# Patient Record
Sex: Male | Born: 1940 | Race: White | Hispanic: No | Marital: Married | Smoking: Never smoker
Health system: Southern US, Community
[De-identification: ages and names within clinical notes are randomized; demographics above are authoritative.]

## PROBLEM LIST (undated history)

## (undated) DIAGNOSIS — I10 Essential (primary) hypertension: Secondary | ICD-10-CM

## (undated) HISTORY — PX: NO PAST SURGERIES: SHX2092

---

## 2013-09-16 ENCOUNTER — Emergency Department (HOSPITAL_COMMUNITY)
Admission: EM | Admit: 2013-09-16 | Discharge: 2013-09-16 | Disposition: A | Discharge status: Home / Self Care | Payer: 59 | Attending: Emergency Medicine | Admitting: Emergency Medicine

## 2013-09-16 ENCOUNTER — Encounter (HOSPITAL_COMMUNITY): Payer: Self-pay | Admitting: Emergency Medicine

## 2013-09-16 DIAGNOSIS — T6391XA Toxic effect of contact with unspecified venomous animal, accidental (unintentional), initial encounter: Secondary | ICD-10-CM | POA: Insufficient documentation

## 2013-09-16 DIAGNOSIS — Y929 Unspecified place or not applicable: Secondary | ICD-10-CM | POA: Diagnosis not present

## 2013-09-16 DIAGNOSIS — IMO0002 Reserved for concepts with insufficient information to code with codable children: Secondary | ICD-10-CM | POA: Insufficient documentation

## 2013-09-16 DIAGNOSIS — T63441A Toxic effect of venom of bees, accidental (unintentional), initial encounter: Secondary | ICD-10-CM

## 2013-09-16 DIAGNOSIS — I10 Essential (primary) hypertension: Secondary | ICD-10-CM | POA: Insufficient documentation

## 2013-09-16 DIAGNOSIS — Y939 Activity, unspecified: Secondary | ICD-10-CM | POA: Insufficient documentation

## 2013-09-16 DIAGNOSIS — Z79899 Other long term (current) drug therapy: Secondary | ICD-10-CM | POA: Insufficient documentation

## 2013-09-16 DIAGNOSIS — T63461A Toxic effect of venom of wasps, accidental (unintentional), initial encounter: Secondary | ICD-10-CM | POA: Insufficient documentation

## 2013-09-16 HISTORY — DX: Essential (primary) hypertension: I10

## 2013-09-16 MED ORDER — DIPHENHYDRAMINE HCL 25 MG PO TABS: 50.0000 mg | ORAL_TABLET | Freq: Four times a day (QID) | ORAL | Status: DC

## 2013-09-16 MED ORDER — FAMOTIDINE IN NACL 20-0.9 MG/50ML-% IV SOLN
20.0000 mg | Freq: Once | INTRAVENOUS | Status: AC
  Administered 2013-09-16: 20 mg via INTRAVENOUS
  Filled 2013-09-16: qty 50

## 2013-09-16 MED ORDER — ONDANSETRON HCL 4 MG/2ML IJ SOLN
4.0000 mg | Freq: Once | INTRAMUSCULAR | Status: AC
Start: 2013-09-16 — End: 2013-09-16
  Administered 2013-09-16: 4 mg via INTRAVENOUS
  Filled 2013-09-16: qty 2

## 2013-09-16 MED ORDER — PREDNISONE 10 MG PO TABS: 60.0000 mg | ORAL_TABLET | Freq: Every day | ORAL | Status: DC

## 2013-09-16 MED ORDER — METHYLPREDNISOLONE SODIUM SUCC 125 MG IJ SOLR
125.0000 mg | Freq: Once | INTRAMUSCULAR | Status: AC
  Administered 2013-09-16: 125 mg via INTRAVENOUS
  Filled 2013-09-16: qty 2

## 2013-09-16 MED ORDER — DIPHENHYDRAMINE HCL 50 MG/ML IJ SOLN
25.0000 mg | Freq: Once | INTRAMUSCULAR | Status: AC
  Administered 2013-09-16: 25 mg via INTRAVENOUS
  Filled 2013-09-16: qty 1

## 2013-09-16 NOTE — ED Provider Notes (Signed)
CSN: [id number]     Arrival date & time 09/16/13  1548 History   First MD Initiated Contact with Patient 09/16/13 1555     Chief Complaint  Patient presents with  . Insect Bite     (Consider location/radiation/quality/duration/timing/severity/associated sxs/prior Treatment) HPI Pt presenting with c/o being stung on the left side of his face.  He states he was working in the yard and lifted an old log and immediately felt a sting on his face. He c/o itching throughout his body, feeling his legs tingling. He had one episode of emesis and now continues to c/o nausea.  No lip or tongue swelling.  Does feel somewhat lightheaded.  No syncope.  No difficulty breathing.  He has had "bad reaction" to hornet sting in the past, states this was many years ago.  There are no other associated systemic symptoms, there are no other alleviating or modifying factors.   Past Medical History  Diagnosis Date  . Hypertension    History reviewed. No pertinent past surgical history. No family history on file. History  Substance Use Topics  . Smoking status: Never Smoker   . Smokeless tobacco: Not on file  . Alcohol Use: No    Review of Systems ROS reviewed and all otherwise negative except for mentioned in HPI    Allergies  Review of patient's allergies indicates no known allergies.  Home Medications   Prior to Admission medications   Medication Sig Start Date End Date Taking? Authorizing Provider  acidophilus (RISAQUAD) CAPS capsule Take 1 capsule by mouth daily.   Yes Historical Provider, MD  BENICAR 20 MG tablet Take 20 mg by mouth every morning.  09/08/13  Yes Historical Provider, MD  Multiple Vitamin (MULTIVITAMIN WITH MINERALS) TABS tablet Take 1 tablet by mouth daily.   Yes Historical Provider, MD  diphenhydrAMINE (BENADRYL) 25 MG tablet Take 2 tablets (50 mg total) by mouth every 6 (six) hours. Take 1-2 tablets every 6 hours x 2 days, then space out to an as needed basis 09/16/13   Threasa Beards, MD  predniSONE (DELTASONE) 10 MG tablet Take 6 tablets (60 mg total) by mouth daily. Take 6, 5, 4, 3, 2, 1 tab po daily qD x 3 days each until all are finished 09/16/13   Threasa Beards, MD   BP 144/84  Pulse 91  Temp(Src) 98.3 F (36.8 C) (Oral)  Resp 21  SpO2 99% Vitals reviewed Physical Exam Physical Examination: General appearance - alert, well appearing, and in no distress Mental status - alert, oriented to person, place, and time Eyes -no conjunctival injection, no scleral icterus Mouth - mucous membranes moist, pharynx normal without lesions Chest - clear to auscultation, no wheezes, rales or rhonchi, symmetric air entry, no increased respiratory effort Heart - normal rate, regular rhythm, normal S1, S2, no murmurs, rubs, clicks or gallops Abdomen - soft, nontender, nondistended, no masses or organomegaly Extremities - peripheral pulses normal, no pedal edema, no clubbing or cyanosis Skin - normal coloration and turgor other than mild hives over anterior chest and neck and face  ED Course  Procedures (including critical care time)  6:03 PM pt has had improvement in his itching, no difficulty breathing or airway involvement Labs Review Labs Reviewed - No data to display  Imaging Review No results found.   EKG Interpretation   Date/Time:  Saturday September 16 2013 17:12:27 EDT Ventricular Rate:  86 PR Interval:  151 QRS Duration: 100 QT Interval:  378 QTC Calculation: 452  R Axis:   90 Text Interpretation:  Sinus rhythm Borderline right axis deviation No old  tracing to compare Confirmed by [facility]  MD, [place] [phone]) on 09/16/2013  10:21:47 PM      MDM   Final diagnoses:  Allergic reaction to bee sting, accidental or unintentional, initial encounter    Pt presenting with itching rash/hives, and nausea and vomiting after being stung by a bee.  Pt treated with benadryl, solumedrol, pepcid- pt feeling improved.  No signs of airway involvement or  anaphylaxis.  Discharged with strict return precautions.  Pt agreeable with plan.    Threasa Beards, MD 09/16/13 [phone]

## 2013-09-16 NOTE — ED Notes (Signed)
Per pt, states he was working in the yard and something bit him on the left side of his face-feels like face is swelling, N/V-states his feet are tingling

## 2013-09-16 NOTE — Discharge Instructions (Signed)
Return to the ED with any concerns including difficulty breathing, vomiting, fainting, decreased level of alertness/lethargy, or any other alarming symptoms

## 2013-09-16 NOTE — ED Notes (Addendum)
Pt reports itching has calmed. Pt lung sounds are equal and clear. Pruritic rash to neck, face,arms, and chest. Ice offered for cheek pain however pt denies offer.

## 2014-11-14 DIAGNOSIS — Z136 Encounter for screening for cardiovascular disorders: Secondary | ICD-10-CM | POA: Diagnosis not present

## 2014-11-14 DIAGNOSIS — Z0000 Encounter for general adult medical examination without abnormal findings: Secondary | ICD-10-CM | POA: Diagnosis not present

## 2014-11-14 DIAGNOSIS — Z79899 Other long term (current) drug therapy: Secondary | ICD-10-CM | POA: Diagnosis not present

## 2014-11-14 DIAGNOSIS — Z125 Encounter for screening for malignant neoplasm of prostate: Secondary | ICD-10-CM | POA: Diagnosis not present

## 2014-11-14 DIAGNOSIS — Z23 Encounter for immunization: Secondary | ICD-10-CM | POA: Diagnosis not present

## 2014-11-14 DIAGNOSIS — L570 Actinic keratosis: Secondary | ICD-10-CM | POA: Diagnosis not present

## 2014-12-31 DIAGNOSIS — N401 Enlarged prostate with lower urinary tract symptoms: Secondary | ICD-10-CM | POA: Diagnosis not present

## 2014-12-31 DIAGNOSIS — N138 Other obstructive and reflux uropathy: Secondary | ICD-10-CM | POA: Diagnosis not present

## 2014-12-31 DIAGNOSIS — R972 Elevated prostate specific antigen [PSA]: Secondary | ICD-10-CM | POA: Diagnosis not present

## 2015-01-18 DIAGNOSIS — L814 Other melanin hyperpigmentation: Secondary | ICD-10-CM | POA: Diagnosis not present

## 2015-01-18 DIAGNOSIS — B079 Viral wart, unspecified: Secondary | ICD-10-CM | POA: Diagnosis not present

## 2015-01-18 DIAGNOSIS — B078 Other viral warts: Secondary | ICD-10-CM | POA: Diagnosis not present

## 2015-01-18 DIAGNOSIS — L821 Other seborrheic keratosis: Secondary | ICD-10-CM | POA: Diagnosis not present

## 2015-01-18 DIAGNOSIS — D1801 Hemangioma of skin and subcutaneous tissue: Secondary | ICD-10-CM | POA: Diagnosis not present

## 2015-01-25 ENCOUNTER — Ambulatory Visit
Admission: RE | Admit: 2015-01-25 | Discharge: 2015-01-25 | Disposition: A | Discharge status: Home / Self Care | Payer: Medicare Other | Source: Ambulatory Visit | Attending: Family Medicine | Admitting: Family Medicine

## 2015-01-25 ENCOUNTER — Other Ambulatory Visit: Payer: Self-pay | Admitting: Family Medicine

## 2015-01-25 DIAGNOSIS — M25461 Effusion, right knee: Secondary | ICD-10-CM

## 2015-02-22 DIAGNOSIS — M25561 Pain in right knee: Secondary | ICD-10-CM | POA: Diagnosis not present

## 2015-02-22 DIAGNOSIS — M170 Bilateral primary osteoarthritis of knee: Secondary | ICD-10-CM | POA: Diagnosis not present

## 2015-02-22 DIAGNOSIS — M25061 Hemarthrosis, right knee: Secondary | ICD-10-CM | POA: Diagnosis not present

## 2015-02-22 DIAGNOSIS — M25069 Hemarthrosis, unspecified knee: Secondary | ICD-10-CM | POA: Diagnosis not present

## 2015-03-02 DIAGNOSIS — M25561 Pain in right knee: Secondary | ICD-10-CM | POA: Diagnosis not present

## 2015-03-11 DIAGNOSIS — M25061 Hemarthrosis, right knee: Secondary | ICD-10-CM | POA: Diagnosis not present

## 2015-03-11 DIAGNOSIS — M170 Bilateral primary osteoarthritis of knee: Secondary | ICD-10-CM | POA: Diagnosis not present

## 2015-03-11 DIAGNOSIS — M25561 Pain in right knee: Secondary | ICD-10-CM | POA: Diagnosis not present

## 2015-06-24 DIAGNOSIS — R972 Elevated prostate specific antigen [PSA]: Secondary | ICD-10-CM | POA: Diagnosis not present

## 2015-08-19 DIAGNOSIS — R972 Elevated prostate specific antigen [PSA]: Secondary | ICD-10-CM | POA: Diagnosis not present

## 2015-08-19 DIAGNOSIS — N400 Enlarged prostate without lower urinary tract symptoms: Secondary | ICD-10-CM | POA: Diagnosis not present

## 2015-09-02 DIAGNOSIS — Z1211 Encounter for screening for malignant neoplasm of colon: Secondary | ICD-10-CM | POA: Diagnosis not present

## 2015-11-18 DIAGNOSIS — E7800 Pure hypercholesterolemia, unspecified: Secondary | ICD-10-CM | POA: Diagnosis not present

## 2015-11-18 DIAGNOSIS — Z23 Encounter for immunization: Secondary | ICD-10-CM | POA: Diagnosis not present

## 2015-11-18 DIAGNOSIS — Z0001 Encounter for general adult medical examination with abnormal findings: Secondary | ICD-10-CM | POA: Diagnosis not present

## 2015-11-18 DIAGNOSIS — K625 Hemorrhage of anus and rectum: Secondary | ICD-10-CM | POA: Diagnosis not present

## 2015-11-18 DIAGNOSIS — Z79899 Other long term (current) drug therapy: Secondary | ICD-10-CM | POA: Diagnosis not present

## 2015-12-23 DIAGNOSIS — K640 First degree hemorrhoids: Secondary | ICD-10-CM | POA: Diagnosis not present

## 2015-12-23 DIAGNOSIS — Z1211 Encounter for screening for malignant neoplasm of colon: Secondary | ICD-10-CM | POA: Diagnosis not present

## 2015-12-23 DIAGNOSIS — K5730 Diverticulosis of large intestine without perforation or abscess without bleeding: Secondary | ICD-10-CM | POA: Diagnosis not present

## 2016-01-16 DIAGNOSIS — B078 Other viral warts: Secondary | ICD-10-CM | POA: Diagnosis not present

## 2016-01-16 DIAGNOSIS — D1801 Hemangioma of skin and subcutaneous tissue: Secondary | ICD-10-CM | POA: Diagnosis not present

## 2016-01-16 DIAGNOSIS — L814 Other melanin hyperpigmentation: Secondary | ICD-10-CM | POA: Diagnosis not present

## 2016-01-16 DIAGNOSIS — L570 Actinic keratosis: Secondary | ICD-10-CM | POA: Diagnosis not present

## 2016-01-16 DIAGNOSIS — L821 Other seborrheic keratosis: Secondary | ICD-10-CM | POA: Diagnosis not present

## 2016-02-17 DIAGNOSIS — R972 Elevated prostate specific antigen [PSA]: Secondary | ICD-10-CM | POA: Diagnosis not present

## 2016-02-24 DIAGNOSIS — R972 Elevated prostate specific antigen [PSA]: Secondary | ICD-10-CM | POA: Diagnosis not present

## 2016-02-24 DIAGNOSIS — N400 Enlarged prostate without lower urinary tract symptoms: Secondary | ICD-10-CM | POA: Diagnosis not present

## 2016-08-17 DIAGNOSIS — R972 Elevated prostate specific antigen [PSA]: Secondary | ICD-10-CM | POA: Diagnosis not present

## 2016-09-07 DIAGNOSIS — N400 Enlarged prostate without lower urinary tract symptoms: Secondary | ICD-10-CM | POA: Diagnosis not present

## 2016-09-07 DIAGNOSIS — R972 Elevated prostate specific antigen [PSA]: Secondary | ICD-10-CM | POA: Diagnosis not present

## 2016-10-16 DIAGNOSIS — Z23 Encounter for immunization: Secondary | ICD-10-CM | POA: Diagnosis not present

## 2016-12-10 DIAGNOSIS — E7800 Pure hypercholesterolemia, unspecified: Secondary | ICD-10-CM | POA: Diagnosis not present

## 2016-12-10 DIAGNOSIS — K219 Gastro-esophageal reflux disease without esophagitis: Secondary | ICD-10-CM | POA: Diagnosis not present

## 2016-12-10 DIAGNOSIS — Z0001 Encounter for general adult medical examination with abnormal findings: Secondary | ICD-10-CM | POA: Diagnosis not present

## 2016-12-10 DIAGNOSIS — Z79899 Other long term (current) drug therapy: Secondary | ICD-10-CM | POA: Diagnosis not present

## 2017-02-16 DIAGNOSIS — R972 Elevated prostate specific antigen [PSA]: Secondary | ICD-10-CM | POA: Diagnosis not present

## 2017-03-01 DIAGNOSIS — R972 Elevated prostate specific antigen [PSA]: Secondary | ICD-10-CM | POA: Diagnosis not present

## 2017-07-13 DIAGNOSIS — B078 Other viral warts: Secondary | ICD-10-CM | POA: Diagnosis not present

## 2017-07-13 DIAGNOSIS — L218 Other seborrheic dermatitis: Secondary | ICD-10-CM | POA: Diagnosis not present

## 2017-07-13 DIAGNOSIS — D1801 Hemangioma of skin and subcutaneous tissue: Secondary | ICD-10-CM | POA: Diagnosis not present

## 2017-07-13 DIAGNOSIS — D225 Melanocytic nevi of trunk: Secondary | ICD-10-CM | POA: Diagnosis not present

## 2017-07-13 DIAGNOSIS — L821 Other seborrheic keratosis: Secondary | ICD-10-CM | POA: Diagnosis not present

## 2017-08-31 DIAGNOSIS — R972 Elevated prostate specific antigen [PSA]: Secondary | ICD-10-CM | POA: Diagnosis not present

## 2017-09-09 DIAGNOSIS — R972 Elevated prostate specific antigen [PSA]: Secondary | ICD-10-CM | POA: Diagnosis not present

## 2017-10-26 DIAGNOSIS — Z91030 Bee allergy status: Secondary | ICD-10-CM | POA: Diagnosis not present

## 2017-10-26 DIAGNOSIS — D62 Acute posthemorrhagic anemia: Secondary | ICD-10-CM | POA: Diagnosis not present

## 2017-10-26 DIAGNOSIS — K921 Melena: Secondary | ICD-10-CM | POA: Diagnosis present

## 2017-10-26 DIAGNOSIS — K922 Gastrointestinal hemorrhage, unspecified: Secondary | ICD-10-CM | POA: Diagnosis not present

## 2017-10-26 DIAGNOSIS — R58 Hemorrhage, not elsewhere classified: Secondary | ICD-10-CM | POA: Diagnosis not present

## 2017-10-26 DIAGNOSIS — S0990XA Unspecified injury of head, initial encounter: Secondary | ICD-10-CM | POA: Diagnosis not present

## 2017-10-26 DIAGNOSIS — S0101XA Laceration without foreign body of scalp, initial encounter: Secondary | ICD-10-CM | POA: Diagnosis not present

## 2017-10-26 DIAGNOSIS — R51 Headache: Secondary | ICD-10-CM | POA: Diagnosis not present

## 2017-10-26 DIAGNOSIS — K5730 Diverticulosis of large intestine without perforation or abscess without bleeding: Secondary | ICD-10-CM | POA: Diagnosis not present

## 2017-10-26 DIAGNOSIS — I959 Hypotension, unspecified: Secondary | ICD-10-CM | POA: Diagnosis not present

## 2017-11-02 DIAGNOSIS — K922 Gastrointestinal hemorrhage, unspecified: Secondary | ICD-10-CM | POA: Diagnosis not present

## 2017-11-02 DIAGNOSIS — Z23 Encounter for immunization: Secondary | ICD-10-CM | POA: Diagnosis not present

## 2017-11-02 DIAGNOSIS — Z4802 Encounter for removal of sutures: Secondary | ICD-10-CM | POA: Diagnosis not present

## 2017-11-22 DIAGNOSIS — K922 Gastrointestinal hemorrhage, unspecified: Secondary | ICD-10-CM | POA: Diagnosis not present

## 2017-12-08 DIAGNOSIS — K922 Gastrointestinal hemorrhage, unspecified: Secondary | ICD-10-CM | POA: Diagnosis not present

## 2017-12-14 DIAGNOSIS — Z79899 Other long term (current) drug therapy: Secondary | ICD-10-CM | POA: Diagnosis not present

## 2017-12-14 DIAGNOSIS — K219 Gastro-esophageal reflux disease without esophagitis: Secondary | ICD-10-CM | POA: Diagnosis not present

## 2017-12-14 DIAGNOSIS — Z8719 Personal history of other diseases of the digestive system: Secondary | ICD-10-CM | POA: Diagnosis not present

## 2017-12-14 DIAGNOSIS — Z0001 Encounter for general adult medical examination with abnormal findings: Secondary | ICD-10-CM | POA: Diagnosis not present

## 2017-12-14 DIAGNOSIS — E7800 Pure hypercholesterolemia, unspecified: Secondary | ICD-10-CM | POA: Diagnosis not present

## 2017-12-14 DIAGNOSIS — Z23 Encounter for immunization: Secondary | ICD-10-CM | POA: Diagnosis not present

## 2018-01-25 DIAGNOSIS — R972 Elevated prostate specific antigen [PSA]: Secondary | ICD-10-CM | POA: Diagnosis not present

## 2018-01-25 DIAGNOSIS — N400 Enlarged prostate without lower urinary tract symptoms: Secondary | ICD-10-CM | POA: Diagnosis not present

## 2018-02-18 DIAGNOSIS — R972 Elevated prostate specific antigen [PSA]: Secondary | ICD-10-CM | POA: Diagnosis not present

## 2018-07-21 DIAGNOSIS — R972 Elevated prostate specific antigen [PSA]: Secondary | ICD-10-CM | POA: Diagnosis not present

## 2018-09-30 DIAGNOSIS — L708 Other acne: Secondary | ICD-10-CM | POA: Diagnosis not present

## 2018-09-30 DIAGNOSIS — I8393 Asymptomatic varicose veins of bilateral lower extremities: Secondary | ICD-10-CM | POA: Diagnosis not present

## 2018-09-30 DIAGNOSIS — L738 Other specified follicular disorders: Secondary | ICD-10-CM | POA: Diagnosis not present

## 2018-09-30 DIAGNOSIS — L821 Other seborrheic keratosis: Secondary | ICD-10-CM | POA: Diagnosis not present

## 2018-09-30 DIAGNOSIS — D229 Melanocytic nevi, unspecified: Secondary | ICD-10-CM | POA: Diagnosis not present

## 2018-09-30 DIAGNOSIS — D1801 Hemangioma of skin and subcutaneous tissue: Secondary | ICD-10-CM | POA: Diagnosis not present

## 2018-09-30 DIAGNOSIS — B078 Other viral warts: Secondary | ICD-10-CM | POA: Diagnosis not present

## 2018-09-30 DIAGNOSIS — L814 Other melanin hyperpigmentation: Secondary | ICD-10-CM | POA: Diagnosis not present

## 2018-10-14 DIAGNOSIS — Z23 Encounter for immunization: Secondary | ICD-10-CM | POA: Diagnosis not present

## 2019-01-21 ENCOUNTER — Ambulatory Visit: Payer: Medicare Other | Attending: Internal Medicine

## 2019-01-21 DIAGNOSIS — Z23 Encounter for immunization: Secondary | ICD-10-CM | POA: Insufficient documentation

## 2019-01-21 NOTE — Progress Notes (Signed)
   Covid-19 Vaccination Clinic  Name:  Luke Ross    MRN: [id number] DOB: Jun 23, 1940  01/21/2019  [name] was observed post Covid-19 immunization for 15 minutes without incidence. He was provided with Vaccine Information Sheet and instruction to access the V-Safe system.   [name] was instructed to call 911 with any severe reactions post vaccine: Marland Kitchen Difficulty breathing  . Swelling of your face and throat  . A fast heartbeat  . A bad rash all over your body  . Dizziness and weakness    Immunizations Administered    Name Date Dose VIS Date Route   Pfizer COVID-19 Vaccine 01/21/2019 11:05 AM 0.3 mL 12/23/2018 Intramuscular   Manufacturer: [employer], [employer]   Lot: [id number]   [place]: [id number]

## 2019-02-09 ENCOUNTER — Ambulatory Visit: Payer: Medicare Other

## 2019-02-11 ENCOUNTER — Ambulatory Visit: Payer: Medicare Other | Attending: Internal Medicine

## 2019-02-11 DIAGNOSIS — Z23 Encounter for immunization: Secondary | ICD-10-CM | POA: Insufficient documentation

## 2019-02-11 NOTE — Progress Notes (Signed)
   Covid-19 Vaccination Clinic  Name:  Kellam Krocker    MRN: [id number] DOB: 1940/09/30  02/11/2019  Mr. Lenhart was observed post Covid-19 immunization for 15 minutes without incidence. He was provided with Vaccine Information Sheet and instruction to access the V-Safe system.   Mr. Beach was instructed to call 911 with any severe reactions post vaccine: Marland Kitchen Difficulty breathing  . Swelling of your face and throat  . A fast heartbeat  . A bad rash all over your body  . Dizziness and weakness    Immunizations Administered    Name Date Dose VIS Date Route   Pfizer COVID-19 Vaccine 02/11/2019  9:40 AM 0.3 mL 12/23/2018 Intramuscular   Manufacturer: [place]   Lot: [id number]   [place]: [id number]

## 2020-01-24 DIAGNOSIS — B078 Other viral warts: Secondary | ICD-10-CM | POA: Diagnosis not present

## 2020-02-23 DIAGNOSIS — B078 Other viral warts: Secondary | ICD-10-CM | POA: Diagnosis not present

## 2020-03-22 DIAGNOSIS — B078 Other viral warts: Secondary | ICD-10-CM | POA: Diagnosis not present

## 2020-04-26 DIAGNOSIS — B078 Other viral warts: Secondary | ICD-10-CM | POA: Diagnosis not present

## 2020-05-28 DIAGNOSIS — B078 Other viral warts: Secondary | ICD-10-CM | POA: Diagnosis not present

## 2020-06-27 DIAGNOSIS — B078 Other viral warts: Secondary | ICD-10-CM | POA: Diagnosis not present

## 2021-01-09 DIAGNOSIS — Z0001 Encounter for general adult medical examination with abnormal findings: Secondary | ICD-10-CM | POA: Diagnosis not present

## 2021-01-09 DIAGNOSIS — E7800 Pure hypercholesterolemia, unspecified: Secondary | ICD-10-CM | POA: Diagnosis not present

## 2021-01-09 DIAGNOSIS — Z79899 Other long term (current) drug therapy: Secondary | ICD-10-CM | POA: Diagnosis not present

## 2021-02-20 ENCOUNTER — Other Ambulatory Visit: Payer: Self-pay | Admitting: Urology

## 2021-02-20 DIAGNOSIS — R972 Elevated prostate specific antigen [PSA]: Secondary | ICD-10-CM

## 2021-03-20 ENCOUNTER — Other Ambulatory Visit: Payer: Self-pay

## 2021-03-20 ENCOUNTER — Ambulatory Visit
Admission: RE | Admit: 2021-03-20 | Discharge: 2021-03-20 | Disposition: A | Discharge status: Home / Self Care | Payer: Medicare Other | Source: Ambulatory Visit | Attending: Urology | Admitting: Urology

## 2021-03-20 DIAGNOSIS — R972 Elevated prostate specific antigen [PSA]: Secondary | ICD-10-CM

## 2021-03-20 MED ORDER — GADOBENATE DIMEGLUMINE 529 MG/ML IV SOLN
15.0000 mL | Freq: Once | INTRAVENOUS | Status: AC | PRN
  Administered 2021-03-20: 15 mL via INTRAVENOUS

## 2021-09-04 DIAGNOSIS — U071 COVID-19: Secondary | ICD-10-CM | POA: Diagnosis not present

## 2021-09-20 ENCOUNTER — Ambulatory Visit
Admission: EM | Admit: 2021-09-20 | Discharge: 2021-09-20 | Disposition: A | Discharge status: Home / Self Care | Payer: Medicare Other

## 2021-09-20 ENCOUNTER — Encounter: Payer: Self-pay | Admitting: *Deleted

## 2021-09-20 DIAGNOSIS — T63441A Toxic effect of venom of bees, accidental (unintentional), initial encounter: Secondary | ICD-10-CM

## 2021-09-20 DIAGNOSIS — Z87892 Personal history of anaphylaxis: Secondary | ICD-10-CM

## 2021-09-20 MED ORDER — EPINEPHRINE 0.3 MG/0.3ML IJ SOAJ: 0.3000 mg | INTRAMUSCULAR | 1 refills | Status: AC | PRN

## 2021-09-20 MED ORDER — DIPHENHYDRAMINE HCL 25 MG PO CAPS: 25.0000 mg | ORAL_CAPSULE | Freq: Four times a day (QID) | ORAL | 0 refills | Status: AC | PRN

## 2021-09-20 MED ORDER — DIPHENHYDRAMINE HCL 25 MG PO CAPS
25.0000 mg | ORAL_CAPSULE | Freq: Once | ORAL | Status: AC
  Administered 2021-09-20: 25 mg via ORAL

## 2021-09-20 MED ORDER — TRIAMCINOLONE ACETONIDE 40 MG/ML IJ SUSP
40.0000 mg | Freq: Once | INTRAMUSCULAR | Status: AC
  Administered 2021-09-20: 40 mg via INTRAMUSCULAR

## 2021-09-20 NOTE — Discharge Instructions (Signed)
During your visit today, you received 25 mg of Benadryl.  Please continue taking Benadryl every 6 hours, your next dose should be due around 4:30 PM.  You also received an injection of Kenalog during your visit today which should significantly reduce your inflammation and swelling in your hand and prevent worsening inflammation from the bee sting.  I sent a prescription for an EpiPen pack to your pharmacy.  Please follow the instructions on the package.  It is very important that you call 911 after your first injection and that you document the time of the injection for the EMS personnel.  The second pen is to be used 5 minutes after the first pen if you do not have meaningful improvement of your symptoms.  As we discussed, for your safety, please reach out to a pest control professional to assist you with relocating the bees that are in your garage to a safer place away from you.  Thank you for visiting urgent care today.

## 2021-09-20 NOTE — ED Triage Notes (Signed)
Pt reports he was stung by probable wasp in left hand yesterday. Today has swelling, redness to left hand with some redness radiating up into left anterior forearm. Denies fevers.

## 2021-09-20 NOTE — ED Provider Notes (Signed)
[facility]    CSN: [id number] Arrival date & time: 09/20/21  6789    HISTORY   Chief Complaint  Patient presents with   Insect Bite   HPI Luke Ross is a pleasant, 81 y.o. male who presents to urgent care today. Pt reports he was stung by probable wasp in left hand yesterday. Today has swelling, redness to left hand with some redness radiating up into left anterior forearm. Denies fevers.  Patient states there is a nest of bees in his garage, states he was actually stung last week as well.  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  Patient states he  never needed it.  The history is provided by the patient.   Past Medical History:  Diagnosis Date   Hypertension    There are no problems to display for this patient.  Past Surgical History:  Procedure Laterality Date   NO PAST SURGERIES      Home Medications    Prior to Admission medications   Medication Sig Start Date End Date Taking? Authorizing Provider  diphenhydrAMINE (BENADRYL) 25 mg capsule Take 1 capsule (25 mg total) by mouth every 6 (six) hours as needed. 09/20/21  Yes Lynden Oxford Scales, PA-C  EPINEPHrine (EPIPEN 2-PAK) 0.3 mg/0.3 mL IJ SOAJ injection Inject 0.3 mg into the muscle as needed for anaphylaxis. Document time of injection.  Call 911.  Repeat in 5 minutes if anaphylaxis is not significantly improved. 09/20/21  Yes Lynden Oxford Scales, PA-C  Probiotic Product (PROBIOTIC PO) Take by mouth.   Yes [provider]    Family History No family history on file. Social History Social History   Tobacco Use   Smoking status: Never  Vaping Use   Vaping Use: Never used  Substance Use Topics   Alcohol use: No   Drug use: No   Allergies   Other  Review of Systems Review of Systems Pertinent findings revealed after performing a 14 point review of systems has been noted in the history  of present illness.  Physical Exam Triage Vital Signs ED Triage Vitals  Enc Vitals Group     BP 11/08/20 0827 (!) 147/82     Pulse Rate 11/08/20 0827 72     Resp 11/08/20 0827 18     Temp 11/08/20 0827 98.3 F (36.8 C)     Temp Source 11/08/20 0827 Oral     SpO2 11/08/20 0827 98 %     Weight --      Height --      Head Circumference --      Peak Flow --      Pain Score 11/08/20 0826 5     Pain Loc --      Pain Edu? --      Excl. in [place]? --   No data found.  Updated Vital Signs BP (!) 158/91   Pulse 80   Temp 98.5 F (36.9 C) (Oral)   Resp 16   SpO2 94%   Physical Exam Vitals and nursing note reviewed.  Constitutional:      General: He is not in acute distress.    Appearance: Normal appearance. He is normal weight. He is not ill-appearing.  HENT:     Head: Normocephalic and atraumatic.  Eyes:     Extraocular Movements: Extraocular movements intact.     Conjunctiva/sclera: Conjunctivae normal.  Pupils: Pupils are equal, round, and reactive to light.  Cardiovascular:     Rate and Rhythm: Normal rate and regular rhythm.  Pulmonary:     Effort: Pulmonary effort is normal.     Breath sounds: Normal breath sounds.  Musculoskeletal:        General: Normal range of motion.     Cervical back: Normal range of motion and neck supple.  Skin:    General: Skin is warm and dry.     Findings: Erythema (Left hand and anterior aspect of left forearm without warmth) present.  Neurological:     General: No focal deficit present.     Mental Status: He is alert and oriented to person, place, and time. Mental status is at baseline.  Psychiatric:        Mood and Affect: Mood normal.        Behavior: Behavior normal.        Thought Content: Thought content normal.        Judgment: Judgment normal.     Visual Acuity Right Eye Distance:   Left Eye Distance:   Bilateral Distance:    Right Eye Near:   Left Eye Near:    Bilateral Near:     UC Couse / Diagnostics /  Procedures:     Radiology No results found.  Procedures Procedures (including critical care time) EKG  Pending results:  Labs Reviewed - No data to display  Medications Ordered in UC: Medications  triamcinolone acetonide (KENALOG-40) injection 40 mg (has no administration in time range)  diphenhydrAMINE (BENADRYL) capsule 25 mg (has no administration in time range)    UC Diagnoses / Final Clinical Impressions(s)   I have reviewed the triage vital signs and the nursing notes.  Pertinent labs & imaging results that were available during my care of the patient were reviewed by me and considered in my medical decision making (see chart for details).    Final diagnoses:  Bee sting reaction, accidental or unintentional, initial encounter  Personal history of anaphylaxis   Patient provided with an injection of Kenalog during his visit today and 25 mg of Benadryl.  Patient advised to continue Benadryl every 6 hours for the next 2 days.  Patient provided with a new EpiPen and advised to throw his old one away.  Return precautions advised.  ED Prescriptions     Medication Sig Dispense Auth. Provider   EPINEPHrine (EPIPEN 2-PAK) 0.3 mg/0.3 mL IJ SOAJ injection Inject 0.3 mg into the muscle as needed for anaphylaxis. Document time of injection.  Call 911.  Repeat in 5 minutes if anaphylaxis is not significantly improved. 1 each Lynden Oxford Scales, PA-C   diphenhydrAMINE (BENADRYL) 25 mg capsule Take 1 capsule (25 mg total) by mouth every 6 (six) hours as needed. 30 capsule Lynden Oxford Scales, PA-C      PDMP not reviewed this encounter.  Disposition Upon Discharge:  Condition: stable for discharge home Home: take medications as prescribed; routine discharge instructions as discussed; follow up as advised.  Patient presented with an acute illness with associated systemic symptoms and significant discomfort requiring urgent management. In my opinion, this is a condition that a  prudent lay person (someone who possesses an average knowledge of health and medicine) may potentially expect to result in complications if not addressed urgently such as respiratory distress, impairment of bodily function or dysfunction of bodily organs.   Routine symptom specific, illness specific and/or disease specific instructions were discussed with the patient and/or  caregiver at length.   As such, the patient has been evaluated and assessed, work-up was performed and treatment was provided in alignment with urgent care protocols and evidence based medicine.  Patient/parent/caregiver has been advised that the patient may require follow up for further testing and treatment if the symptoms continue in spite of treatment, as clinically indicated and appropriate.  If the patient was tested for COVID-19, Influenza and/or RSV, then the patient/parent/guardian was advised to isolate at home pending the results of his/her diagnostic coronavirus test and potentially longer if they're positive. I have also advised pt that if his/her COVID-19 test returns positive, it's recommended to self-isolate for at least 10 days after symptoms first appeared AND until fever-free for 24 hours without fever reducer AND other symptoms have improved or resolved. Discussed self-isolation recommendations as well as instructions for household member/close contacts as per the [facility] and [place] DHHS, and also gave patient the [place] packet with this information.  Patient/parent/caregiver has been advised to return to the [facility] or PCP in 3-5 days if no better; to PCP or the Emergency Department if new signs and symptoms develop, or if the current signs or symptoms continue to change or worsen for further workup, evaluation and treatment as clinically indicated and appropriate  The patient will follow up with their current PCP if and as advised. If the patient does not currently have a PCP we will assist them in obtaining one.   The  patient may need specialty follow up if the symptoms continue, in spite of conservative treatment and management, for further workup, evaluation, consultation and treatment as clinically indicated and appropriate.  Patient/parent/caregiver verbalized understanding and agreement of plan as discussed.  All questions were addressed during visit.  Please see discharge instructions below for further details of plan.  Discharge Instructions:   Discharge Instructions      During your visit today, you received 25 mg of Benadryl.  Please continue taking Benadryl every 6 hours, your next dose should be due around 4:30 PM.  You also received an injection of Kenalog during your visit today which should significantly reduce your inflammation and swelling in your hand and prevent worsening inflammation from the bee sting.  I sent a prescription for an EpiPen pack to your pharmacy.  Please follow the instructions on the package.  It is very important that you call 911 after your first injection and that you document the time of the injection for the EMS personnel.  The second pen is to be used 5 minutes after the first pen if you do not have meaningful improvement of your symptoms.  As we discussed, for your safety, please reach out to a pest control professional to assist you with relocating the bees that are in your garage to a safer place away from you.  Thank you for visiting urgent care today.    This office note has been dictated using Museum/gallery curator.  Unfortunately, this method of dictation can sometimes lead to typographical or grammatical errors.  I apologize for your inconvenience in advance if this occurs.  Please do not hesitate to reach out to me if clarification is needed.      Lynden Oxford Scales, PA-C 09/20/21 1240

## 2021-09-24 DIAGNOSIS — Z23 Encounter for immunization: Secondary | ICD-10-CM | POA: Diagnosis not present

## 2022-01-19 DIAGNOSIS — E7800 Pure hypercholesterolemia, unspecified: Secondary | ICD-10-CM | POA: Diagnosis not present

## 2022-01-19 DIAGNOSIS — Z79899 Other long term (current) drug therapy: Secondary | ICD-10-CM | POA: Diagnosis not present

## 2022-01-19 DIAGNOSIS — H9193 Unspecified hearing loss, bilateral: Secondary | ICD-10-CM | POA: Diagnosis not present

## 2022-01-19 DIAGNOSIS — K219 Gastro-esophageal reflux disease without esophagitis: Secondary | ICD-10-CM | POA: Diagnosis not present

## 2022-01-19 DIAGNOSIS — Z23 Encounter for immunization: Secondary | ICD-10-CM | POA: Diagnosis not present

## 2022-01-19 DIAGNOSIS — Z0001 Encounter for general adult medical examination with abnormal findings: Secondary | ICD-10-CM | POA: Diagnosis not present

## 2022-11-05 IMAGING — MR MR PROSTATE WO/W CM
12 series · 48 of 48 positions shown · IV contrast (multihance)
Comparison: None.

CLINICAL DATA: Elevated PSA level of 14.50 on 02/07/2021. Prior
prostate biopsy 09/14/2008 was benign.

EXAM:
MR PROSTATE WITHOUT AND WITH CONTRAST
TECHNIQUE: Multiplanar multisequence MRI images were obtained of the pelvis
centered about the prostate. Pre and post contrast images were
obtained.
CONTRAST:  15mL MULTIHANCE GADOBENATE DIMEGLUMINE 529 MG/ML IV SOLN

[Series 3: T2 · coronal · 3.0mm · 0.56mm/px · 1 of 23 slices shown (1 of 3)]
[im 1/23]
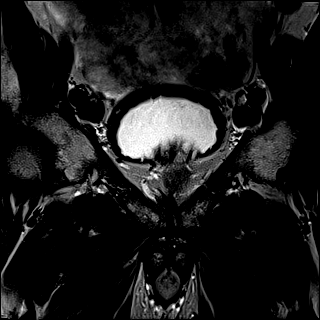

[Series 4: T1 · axial · 5.0mm · 1.25mm/px · 1 of 88 slices shown]
[im 1/88]
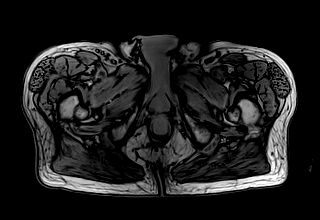

[Series 5: DWI · axial · 3.0mm · 1.75mm/px · z∈[-6,+72]mm · 2 of 81 slices shown (1 of 3)]
[im 1/81]
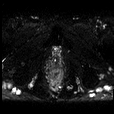
[im 81/81]
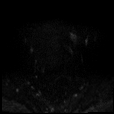

[Series 6: DWI · axial · 3.0mm · 1.75mm/px · 1 of 27 slices shown (2 of 3)]
[im 1/27]
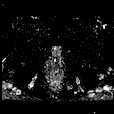

[Series 7: DWI · axial · 3.0mm · 1.75mm/px · 1 of 27 slices shown (3 of 3)]
[im 1/27]
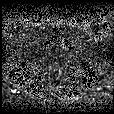

[Series 8: T2 · axial · 3.0mm · 0.56mm/px · 1 of 27 slices shown (2 of 3)]
[im 1/27]
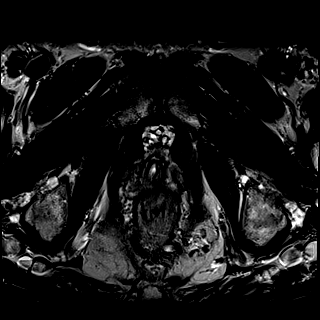

[Series 9: T2 · axial · 1.0mm · 1.04mm/px · z∈[-7,+72]mm · 2 of 80 slices shown (3 of 3)]
[im 1/80]
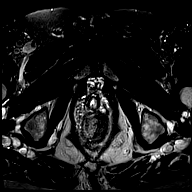
[im 80/80]
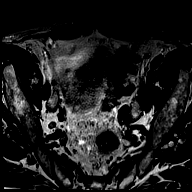

[Series 10: pre t1_twist_tra_dyn · axial · non-contrast · 3.5mm · 0.83mm/px · 1 of 22 slices shown]
[im 1/22]
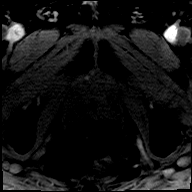

[Series 11: post t1_twist_tra_dyn-copy center · axial · non-contrast · 3.5mm · 0.83mm/px · z∈[-4,+69]mm · 17 of 660 slices shown]
[im 1/660]
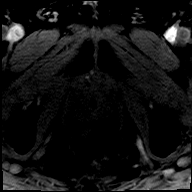
[im 42/660]
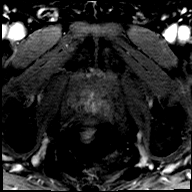
[im 83/660]
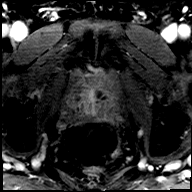
[im 124/660]
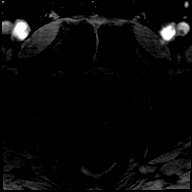
[im 165/660]
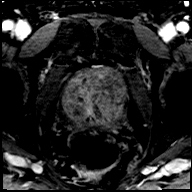
[im 206/660]
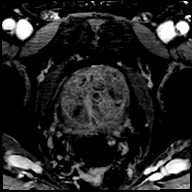
[im 248/660]
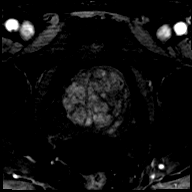
[im 289/660]
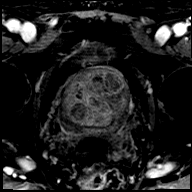
[im 330/660]
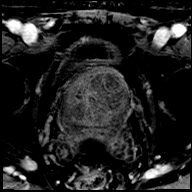
[im 371/660]
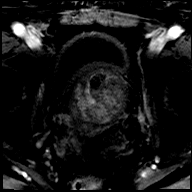
[im 412/660]
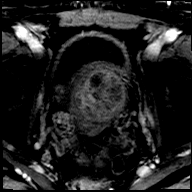
[im 454/660]
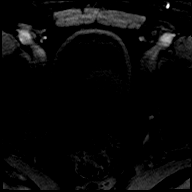
[im 495/660]
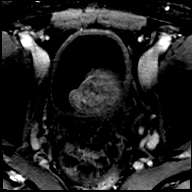
[im 536/660]
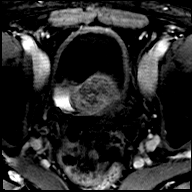
[im 577/660]
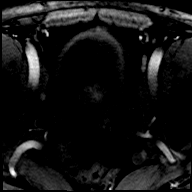
[im 618/660]
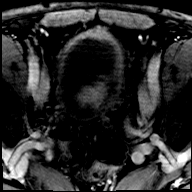
[im 660/660]
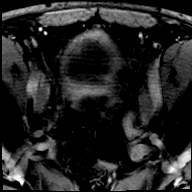

[Series 12: post t1_twist_tra_dyn-copy cent_sub · axial · 3.5mm · 0.83mm/px · z∈[-4,+69]mm · 17 of 638 slices shown]
[im 1/638]
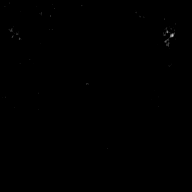
[im 40/638]
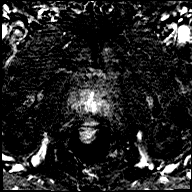
[im 80/638]
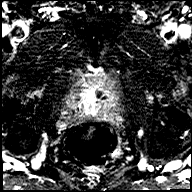
[im 120/638]
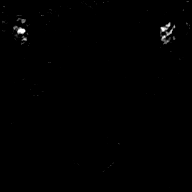
[im 160/638]
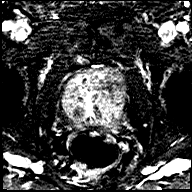
[im 200/638]
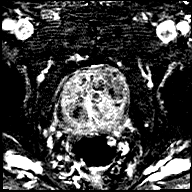
[im 239/638]
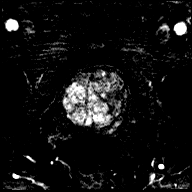
[im 279/638]
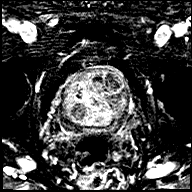
[im 319/638]
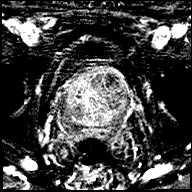
[im 359/638]
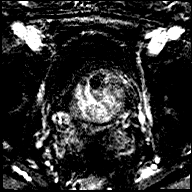
[im 399/638]
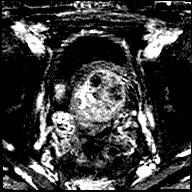
[im 438/638]
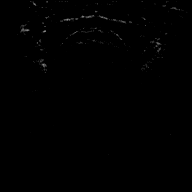
[im 478/638]
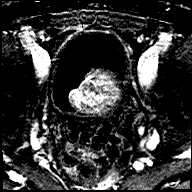
[im 518/638]
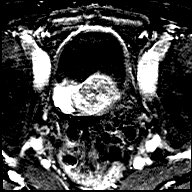
[im 558/638]
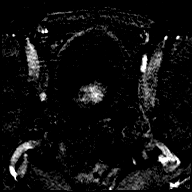
[im 598/638]
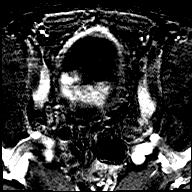
[im 638/638]
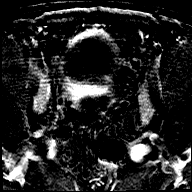

[Series 13: t1_vibe_dixon_tra_f · axial · 2.5mm · 0.91mm/px · z∈[-20,+178]mm · 2 of 80 slices shown]
[im 1/80]
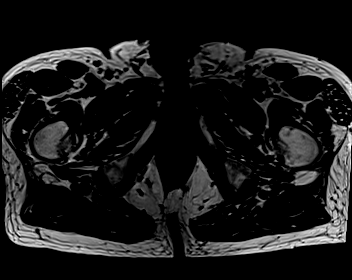
[im 80/80]
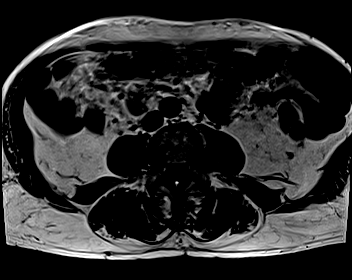

[Series 14: t1_vibe_dixon_tra_w · axial · 2.5mm · 0.91mm/px · z∈[-20,+178]mm · 2 of 80 slices shown]
[im 1/80]
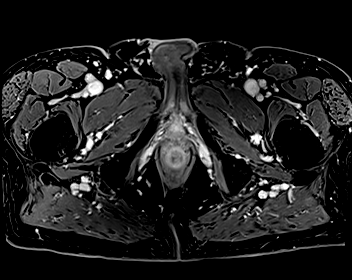
[im 80/80]
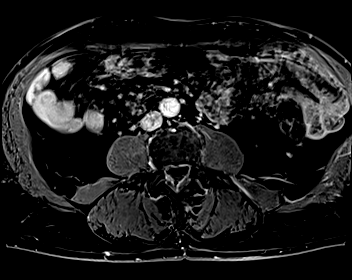

[48 of 48 positions shown; findings below may reference images not displayed]

FINDINGS: Prostate:

Encapsulated nodularity in the transition zone compatible with
benign prostatic hypertrophy. The prostate gland indents the urinary
bladder. Mild signal heterogeneity in the central gland without
overtly suspicious lesion. No worrisome peripheral zone lesion or
abnormal restriction of diffusion in the prostate gland. Overall no
specific lesion of intermediate or higher suspicion for focal
prostate cancer is identified.

Volume: 3D volumetric analysis: Prostate volume 121.27 cc (6.3 by
5.7 by 7.1 cm).

Transcapsular spread:  Absent

Seminal vesicle involvement: Absent

Neurovascular bundle involvement: Absent

Pelvic adenopathy: Absent

Bone metastasis: Absent

Other findings: Sigmoid colon diverticulosis.
IMPRESSION: 1. No focal lesion of intermediate or higher suspicion for prostate
cancer is identified.
2. Benign prostatic hypertrophy and prostatomegaly.

## 2023-01-22 DIAGNOSIS — K219 Gastro-esophageal reflux disease without esophagitis: Secondary | ICD-10-CM | POA: Diagnosis not present

## 2023-01-22 DIAGNOSIS — Z0001 Encounter for general adult medical examination with abnormal findings: Secondary | ICD-10-CM | POA: Diagnosis not present

## 2023-01-22 DIAGNOSIS — Z79899 Other long term (current) drug therapy: Secondary | ICD-10-CM | POA: Diagnosis not present

## 2023-01-22 DIAGNOSIS — E7800 Pure hypercholesterolemia, unspecified: Secondary | ICD-10-CM | POA: Diagnosis not present

## 2023-01-22 DIAGNOSIS — Z23 Encounter for immunization: Secondary | ICD-10-CM | POA: Diagnosis not present

## 2023-03-23 DIAGNOSIS — H25013 Cortical age-related cataract, bilateral: Secondary | ICD-10-CM | POA: Diagnosis not present

## 2023-03-23 DIAGNOSIS — H2513 Age-related nuclear cataract, bilateral: Secondary | ICD-10-CM | POA: Diagnosis not present

## 2023-03-23 DIAGNOSIS — H5203 Hypermetropia, bilateral: Secondary | ICD-10-CM | POA: Diagnosis not present

## 2023-03-23 DIAGNOSIS — H52203 Unspecified astigmatism, bilateral: Secondary | ICD-10-CM | POA: Diagnosis not present

## 2023-03-23 DIAGNOSIS — H524 Presbyopia: Secondary | ICD-10-CM | POA: Diagnosis not present

## 2023-04-12 DIAGNOSIS — H52221 Regular astigmatism, right eye: Secondary | ICD-10-CM | POA: Diagnosis not present

## 2023-04-12 DIAGNOSIS — H25011 Cortical age-related cataract, right eye: Secondary | ICD-10-CM | POA: Diagnosis not present

## 2023-04-12 DIAGNOSIS — H2511 Age-related nuclear cataract, right eye: Secondary | ICD-10-CM | POA: Diagnosis not present

## 2023-04-12 DIAGNOSIS — H25811 Combined forms of age-related cataract, right eye: Secondary | ICD-10-CM | POA: Diagnosis not present

## 2023-05-03 DIAGNOSIS — H25812 Combined forms of age-related cataract, left eye: Secondary | ICD-10-CM | POA: Diagnosis not present

## 2023-05-03 DIAGNOSIS — H52222 Regular astigmatism, left eye: Secondary | ICD-10-CM | POA: Diagnosis not present

## 2023-05-03 DIAGNOSIS — H25012 Cortical age-related cataract, left eye: Secondary | ICD-10-CM | POA: Diagnosis not present

## 2023-05-03 DIAGNOSIS — H2512 Age-related nuclear cataract, left eye: Secondary | ICD-10-CM | POA: Diagnosis not present

## 2023-06-10 DIAGNOSIS — Z961 Presence of intraocular lens: Secondary | ICD-10-CM | POA: Diagnosis not present

## 2023-10-11 DIAGNOSIS — Z23 Encounter for immunization: Secondary | ICD-10-CM | POA: Diagnosis not present
